# Patient Record
Sex: Female | Born: 1970 | Race: Black or African American | Hispanic: No | Marital: Married | Smoking: Former smoker
Health system: Southern US, Community
[De-identification: ages and names within clinical notes are randomized; demographics above are authoritative.]

## PROBLEM LIST (undated history)

## (undated) DIAGNOSIS — G4730 Sleep apnea, unspecified: Secondary | ICD-10-CM

## (undated) DIAGNOSIS — J302 Other seasonal allergic rhinitis: Secondary | ICD-10-CM

## (undated) HISTORY — DX: Sleep apnea, unspecified: G47.30

## (undated) HISTORY — PX: LAPAROSCOPY: SHX197

## (undated) HISTORY — DX: Other seasonal allergic rhinitis: J30.2

---

## 2010-11-17 ENCOUNTER — Other Ambulatory Visit (HOSPITAL_COMMUNITY)
Admission: RE | Admit: 2010-11-17 | Discharge: 2010-11-17 | Disposition: A | Discharge status: Home / Self Care | Payer: BC Managed Care – PPO | Source: Ambulatory Visit | Attending: Family Medicine | Admitting: Family Medicine

## 2010-11-17 DIAGNOSIS — Z0000 Encounter for general adult medical examination without abnormal findings: Secondary | ICD-10-CM | POA: Insufficient documentation

## 2013-03-21 ENCOUNTER — Encounter (HOSPITAL_COMMUNITY): Payer: Self-pay | Admitting: Emergency Medicine

## 2013-03-21 ENCOUNTER — Emergency Department (HOSPITAL_COMMUNITY)
Admission: EM | Admit: 2013-03-21 | Discharge: 2013-03-21 | Disposition: A | Discharge status: Home / Self Care | Payer: Commercial Indemnity | Attending: Emergency Medicine | Admitting: Emergency Medicine

## 2013-03-21 DIAGNOSIS — R110 Nausea: Secondary | ICD-10-CM | POA: Insufficient documentation

## 2013-03-21 DIAGNOSIS — R1013 Epigastric pain: Secondary | ICD-10-CM | POA: Diagnosis not present

## 2013-03-21 DIAGNOSIS — R0602 Shortness of breath: Secondary | ICD-10-CM | POA: Insufficient documentation

## 2013-03-21 DIAGNOSIS — R209 Unspecified disturbances of skin sensation: Secondary | ICD-10-CM | POA: Insufficient documentation

## 2013-03-21 DIAGNOSIS — R42 Dizziness and giddiness: Secondary | ICD-10-CM | POA: Diagnosis not present

## 2013-03-21 DIAGNOSIS — R197 Diarrhea, unspecified: Secondary | ICD-10-CM | POA: Insufficient documentation

## 2013-03-21 DIAGNOSIS — T7840XA Allergy, unspecified, initial encounter: Secondary | ICD-10-CM

## 2013-03-21 DIAGNOSIS — Z87891 Personal history of nicotine dependence: Secondary | ICD-10-CM | POA: Insufficient documentation

## 2013-03-21 DIAGNOSIS — H539 Unspecified visual disturbance: Secondary | ICD-10-CM | POA: Insufficient documentation

## 2013-03-21 DIAGNOSIS — I10 Essential (primary) hypertension: Secondary | ICD-10-CM | POA: Insufficient documentation

## 2013-03-21 DIAGNOSIS — R51 Headache: Secondary | ICD-10-CM | POA: Insufficient documentation

## 2013-03-21 DIAGNOSIS — Z79899 Other long term (current) drug therapy: Secondary | ICD-10-CM | POA: Insufficient documentation

## 2013-03-21 LAB — LIPASE, BLOOD: Lipase: 26 U/L (ref 11–59)

## 2013-03-21 LAB — COMPREHENSIVE METABOLIC PANEL
ALT: 14 U/L (ref 0–35)
AST: 19 U/L (ref 0–37)
Albumin: 4 g/dL (ref 3.5–5.2)
Alkaline Phosphatase: 58 U/L (ref 39–117)
Chloride: 103 mEq/L (ref 96–112)
Creatinine, Ser: 0.93 mg/dL (ref 0.50–1.10)
GFR calc Af Amer: 86 mL/min — ABNORMAL LOW (ref >90)
GFR calc non Af Amer: 74 mL/min — ABNORMAL LOW (ref >90)
Glucose, Bld: 95 mg/dL (ref 70–99)
Potassium: 4.5 mEq/L (ref 3.5–5.1)
Sodium: 139 mEq/L (ref 135–145)
Total Bilirubin: 0.3 mg/dL (ref 0.3–1.2)

## 2013-03-21 LAB — CBC WITH DIFFERENTIAL/PLATELET
Eosinophils Absolute: 0 10*3/uL (ref 0.0–0.7)
Eosinophils Relative: 0 % (ref 0–5)
Lymphs Abs: 2.5 10*3/uL (ref 0.7–4.0)
MCH: 28.8 pg (ref 26.0–34.0)
MCHC: 32.5 g/dL (ref 30.0–36.0)
MCV: 88.9 fL (ref 78.0–100.0)
Monocytes Relative: 6 % (ref 3–12)
Neutrophils Relative %: 68 % (ref 43–77)
Platelets: 277 10*3/uL (ref 150–400)
RBC: 4.68 MIL/uL (ref 3.87–5.11)

## 2013-03-21 MED ORDER — FAMOTIDINE IN NACL 20-0.9 MG/50ML-% IV SOLN
20.0000 mg | Freq: Once | INTRAVENOUS | Status: AC
  Administered 2013-03-21: 20 mg via INTRAVENOUS
  Filled 2013-03-21: qty 50

## 2013-03-21 MED ORDER — FAMOTIDINE 20 MG PO TABS: 20.0000 mg | ORAL_TABLET | Freq: Two times a day (BID) | ORAL | Status: DC

## 2013-03-21 MED ORDER — ALBUTEROL SULFATE HFA 108 (90 BASE) MCG/ACT IN AERS
2.0000 | INHALATION_SPRAY | Freq: Once | RESPIRATORY_TRACT | Status: DC
  Filled 2013-03-21: qty 6.7

## 2013-03-21 MED ORDER — PREDNISONE (PAK) 10 MG PO TABS: ORAL_TABLET | Freq: Every day | ORAL | Status: DC

## 2013-03-21 MED ORDER — METHYLPREDNISOLONE SODIUM SUCC 125 MG IJ SOLR
125.0000 mg | Freq: Once | INTRAMUSCULAR | Status: AC
  Administered 2013-03-21: 125 mg via INTRAVENOUS
  Filled 2013-03-21: qty 2

## 2013-03-21 MED ORDER — DIPHENHYDRAMINE HCL 25 MG PO TABS: 25.0000 mg | ORAL_TABLET | Freq: Four times a day (QID) | ORAL | Status: DC

## 2013-03-21 MED ORDER — DIPHENHYDRAMINE HCL 50 MG/ML IJ SOLN
25.0000 mg | Freq: Once | INTRAMUSCULAR | Status: AC
  Administered 2013-03-21: 25 mg via INTRAVENOUS
  Filled 2013-03-21: qty 1

## 2013-03-21 NOTE — ED Provider Notes (Signed)
Medical screening examination/treatment/procedure(s) were performed by non-physician practitioner and as supervising physician I was immediately available for consultation/collaboration.  EKG Interpretation   None         Nataly Pacifico H Gared Gillie, MD 03/21/13 2216 

## 2013-03-21 NOTE — ED Notes (Signed)
Pt states she woke this morning had hives, nausea, headache, diarrhea.  Pt has had these same symptoms before.  Pt states she is feeling better now.  Pt states "I took my tampon out this morning and my insides felt like mush."  Pt concerned about toxic shock syndrome?

## 2013-03-21 NOTE — ED Provider Notes (Signed)
CSN: [id number]     Arrival date & time 03/21/13  1447 History   First MD Initiated Contact with Patient 03/21/13 1638     Chief Complaint  Patient presents with  . Headache  . Nausea  . Diarrhea  . Urticaria   (Consider location/radiation/quality/duration/timing/severity/associated sxs/prior Treatment) HPI Patient has a history of allergic reactions from unknown cause for the past two years, has seen [place] allergist.  Reports similar symptoms starting this morning.  Has had headache, lightheadedness, tingling all over her body, feeling like her chest is caving in, epigastric tenderness, "eyes weak", 4 soft bowel movements, throat tightness.  The course was improved after taking methylprednisone this morning, but she felt it might be getting worse so she came in.  States she last had an episode one month ago during her period, states she started having symptoms when she first started her period Dec 7, and this morning removed her overnight tampon (in approximately 10-11 hours) and felt better immediately.    History reviewed. No pertinent past medical history. Past Surgical History  Procedure Laterality Date  . Laparoscopy    . Cesarean section     No family history on file. History  Substance Use Topics  . Smoking status: Former Games developer  . Smokeless tobacco: Not on file  . Alcohol Use: Yes   OB History   Grav Para Term Preterm Abortions TAB SAB Ect Mult Living                 Review of Systems  Constitutional: Negative for fever and chills.  HENT: Negative for trouble swallowing.   Eyes: Positive for visual disturbance.  Respiratory: Positive for shortness of breath. Negative for cough.   Cardiovascular: Negative for chest pain.  Gastrointestinal: Positive for abdominal pain and diarrhea. Negative for nausea and vomiting.  Genitourinary: Negative for dysuria, urgency, frequency, vaginal bleeding and vaginal discharge.  Musculoskeletal: Negative for myalgias.   Neurological: Positive for dizziness and headaches.    Allergies  Sulfa antibiotics  Home Medications   Current Outpatient Rx  Name  Route  Sig  Dispense  Refill  . EPINEPHrine (EPI-PEN) 0.3 mg/0.3 mL SOAJ injection   Intramuscular   Inject 0.3 mg into the muscle once.         . fexofenadine (ALLEGRA) 180 MG tablet   Oral   Take 360 mg by mouth daily.         . methylPREDNISolone (MEDROL) 4 MG tablet   Oral   Take 4 mg by mouth daily as needed (allergic reaction).          BP 136/95  Pulse 86  Temp(Src) 99.4 F (37.4 C) (Oral)  Resp 16  Wt 192 lb 14.4 oz (87.499 kg)  SpO2 100%  LMP 03/21/2013 Physical Exam  Nursing note and vitals reviewed. Constitutional: She appears well-developed and well-nourished. No distress.  HENT:  Head: Normocephalic and atraumatic.  Mouth/Throat: Oropharynx is clear and moist. No oropharyngeal exudate.  Airway widely patent.  No edema.   Neck: Neck supple.  Cardiovascular: Normal rate and regular rhythm.   Pulmonary/Chest: Effort normal and breath sounds normal. No stridor. No respiratory distress. She has no wheezes. She has no rales.  Abdominal: Soft. She exhibits no distension. There is tenderness in the epigastric area. There is no rebound and no guarding.  Neurological: She is alert.  Skin: She is not diaphoretic.    ED Course  Procedures (including critical care time) Labs Review Labs Reviewed  COMPREHENSIVE METABOLIC PANEL -  Abnormal; Notable for the following:    Total Protein 8.4 (*)    GFR calc non Af Amer 74 (*)    GFR calc Af Amer 86 (*)    All other components within normal limits  CBC WITH DIFFERENTIAL  LIPASE, BLOOD   Imaging Review No results found.  EKG Interpretation   None      Discussed pt with Dr Silverio Lay.   MDM   1. Allergic reaction, initial encounter     Pt with hx allergic reaction with unknown cause.  Appears she may be allergic to her tampons.  No airway concerns.  Lungs are clear.  No  rash, though patient feels tingling that usually precedes her rash, and has also taken her home methylprednisone.  Labs unremarkable.  Vitals normal.  Pt was concerned about TSS, but I see no evidence of this.   Likely allergic reaction.  IV solu medrol, benedryl, pepcid given here.  Pt remained well-appearing, no appearance of rash.  No worsening of airway.  After medications denied SOB.  Offered continued monitoring but patient was eager to get home.  D/C with strict return precautions.  As patient seems to be very in tune with her symptoms, has good follow up with PCP and allergist, has an epi pen, and has been dealing with this intermittently for two years, I feel confident that she will return for any worsening and will follow up.  D/C home with prednisone, benadryl, pepcid.  PCP, allergist follow up.  I suspect she is allergic to a component of the tampons she is using.  I have advised her to stop using any tampons until she follows up with her doctor and to take the box to her next appointment. Discussed results, findings, treatment, and follow up  with patient.  Pt given return precautions.  Pt verbalizes understanding and agrees with plan.        Trixie Dredge, PA-C 03/21/13 1821

## 2013-12-20 ENCOUNTER — Other Ambulatory Visit (HOSPITAL_COMMUNITY)
Admission: RE | Admit: 2013-12-20 | Discharge: 2013-12-20 | Disposition: A | Discharge status: Home / Self Care | Payer: Commercial Indemnity | Source: Ambulatory Visit | Attending: Nurse Practitioner | Admitting: Nurse Practitioner

## 2013-12-20 ENCOUNTER — Other Ambulatory Visit: Payer: Self-pay | Admitting: Nurse Practitioner

## 2013-12-20 DIAGNOSIS — R87810 Cervical high risk human papillomavirus (HPV) DNA test positive: Secondary | ICD-10-CM | POA: Insufficient documentation

## 2013-12-20 DIAGNOSIS — Z1151 Encounter for screening for human papillomavirus (HPV): Secondary | ICD-10-CM | POA: Diagnosis present

## 2013-12-20 DIAGNOSIS — Z124 Encounter for screening for malignant neoplasm of cervix: Secondary | ICD-10-CM | POA: Insufficient documentation

## 2013-12-25 LAB — CYTOLOGY - PAP

## 2014-01-08 ENCOUNTER — Other Ambulatory Visit: Payer: Self-pay | Admitting: Nurse Practitioner

## 2014-07-17 ENCOUNTER — Other Ambulatory Visit (HOSPITAL_COMMUNITY): Payer: Self-pay | Admitting: Family Medicine

## 2014-07-17 DIAGNOSIS — R109 Unspecified abdominal pain: Secondary | ICD-10-CM

## 2014-07-17 DIAGNOSIS — K625 Hemorrhage of anus and rectum: Secondary | ICD-10-CM

## 2014-07-17 DIAGNOSIS — R110 Nausea: Secondary | ICD-10-CM

## 2014-07-18 ENCOUNTER — Encounter (HOSPITAL_COMMUNITY): Payer: Self-pay

## 2014-07-18 ENCOUNTER — Ambulatory Visit (HOSPITAL_COMMUNITY)
Admission: RE | Admit: 2014-07-18 | Discharge: 2014-07-18 | Disposition: A | Discharge status: Home / Self Care | Payer: Managed Care, Other (non HMO) | Source: Ambulatory Visit | Attending: Family Medicine | Admitting: Family Medicine

## 2014-07-18 DIAGNOSIS — R110 Nausea: Secondary | ICD-10-CM | POA: Insufficient documentation

## 2014-07-18 DIAGNOSIS — R109 Unspecified abdominal pain: Secondary | ICD-10-CM | POA: Diagnosis not present

## 2014-07-18 DIAGNOSIS — K625 Hemorrhage of anus and rectum: Secondary | ICD-10-CM | POA: Diagnosis not present

## 2014-07-18 MED ORDER — IOHEXOL 300 MG/ML  SOLN
100.0000 mL | Freq: Once | INTRAMUSCULAR | Status: AC | PRN
  Administered 2014-07-18: 100 mL via INTRAVENOUS

## 2015-03-18 ENCOUNTER — Other Ambulatory Visit: Payer: Self-pay | Admitting: Gastroenterology

## 2016-01-22 ENCOUNTER — Other Ambulatory Visit: Payer: Self-pay | Admitting: Family

## 2016-01-22 ENCOUNTER — Ambulatory Visit
Admission: RE | Admit: 2016-01-22 | Discharge: 2016-01-22 | Disposition: A | Discharge status: Home / Self Care | Payer: Managed Care, Other (non HMO) | Source: Ambulatory Visit | Attending: Family | Admitting: Family

## 2016-01-22 DIAGNOSIS — R0600 Dyspnea, unspecified: Secondary | ICD-10-CM

## 2016-01-22 DIAGNOSIS — R062 Wheezing: Secondary | ICD-10-CM

## 2016-02-05 ENCOUNTER — Encounter: Payer: Self-pay | Admitting: Emergency Medicine

## 2016-02-05 ENCOUNTER — Ambulatory Visit (INDEPENDENT_AMBULATORY_CARE_PROVIDER_SITE_OTHER): Payer: Managed Care, Other (non HMO) | Admitting: Emergency Medicine

## 2016-02-05 DIAGNOSIS — R0602 Shortness of breath: Secondary | ICD-10-CM

## 2016-02-05 DIAGNOSIS — R0600 Dyspnea, unspecified: Secondary | ICD-10-CM | POA: Insufficient documentation

## 2016-02-05 NOTE — Progress Notes (Signed)
Subjective:    Patient ID: Sarah Conrad, female    DOB: 03/05/1971, 45 y.o.   MRN: [id number]  HPI  45 year old woman with a minimal tobacco history (2 pack years), allergic rhinitis, sleep apnea diagnosed 2010 sometimes on CPAP, but not since 2 weeks ago. Also with a hx of GERD and allergic rhinitis / urticaria. She is noted to have a normal exercise tolerance test, echocardiogram and Holter monitor on evaluation for palpitations by Dr Nadara Eaton. She is referred today for evaluation of dyspnea and wheeze.  She reports that this has been longstanding. It can be episodic, feels like she is unable to move air, a tightness in her UA or chest. Can happen either at rest or w exertion. She has to wait for relief, sometimes 2 min, can be 30 min.   On prilosec 40 bid, allegra qd.   Review of Systems  Constitutional: Negative for fever and unexpected weight change.  HENT: Positive for sinus pressure. Negative for congestion, dental problem, ear pain, nosebleeds, postnasal drip, rhinorrhea, sneezing, sore throat and trouble swallowing.   Eyes: Negative for redness and itching.  Respiratory: Positive for shortness of breath and wheezing. Negative for cough and chest tightness.        Mucus production  Cardiovascular: Negative for palpitations and leg swelling.  Gastrointestinal: Negative for nausea and vomiting.  Genitourinary: Negative for dysuria.  Musculoskeletal: Negative for joint swelling.  Skin: Negative for rash.  Neurological: Positive for headaches.  Hematological: Does not bruise/bleed easily.  Psychiatric/Behavioral: Negative for dysphoric mood. The patient is not nervous/anxious.    Past Medical History:  Diagnosis Date  . Seasonal allergies   . Sleep apnea      History reviewed. No pertinent family history.   Social History   Social History  . Marital status: Married    Spouse name: N/A  . Number of children: N/A  . Years of education: N/A   Occupational History  . 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 Smoking status: Former Smoker    Packs/day: 0.25    Years: 2.00    Quit date: 04/12/1994  . Smokeless tobacco: Never Used  . Alcohol use Yes     Comment: 2-3 drinks of wine daily  . Drug use: No  . Sexual activity: Not on file   Other Topics Concern  . Not on file   Social History Narrative  . No narrative on file  no inhaled exposures She has lived in [place], [place], [place]   Allergies  Allergen Reactions  . Sulfa Antibiotics Hives     Outpatient Medications Prior to Visit  Medication Sig Dispense Refill  . EPINEPHrine (EPI-PEN) 0.3 mg/0.3 mL SOAJ injection Inject 0.3 mg into the muscle once.    . fexofenadine (ALLEGRA) 180 MG tablet Take 360 mg by mouth daily.    . diphenhydrAMINE (BENADRYL) 25 MG tablet Take 1 tablet (25 mg total) by mouth every 6 (six) hours. X 3 days, then as needed (Patient not taking: Reported on 02/05/2016) 20 tablet 0  . famotidine (PEPCID) 20 MG tablet Take 1 tablet (20 mg total) by mouth 2 (two) times daily. X 3 days then PRN (Patient not taking: Reported on 02/05/2016) 30 tablet 0  . methylPREDNISolone (MEDROL) 4 MG tablet Take 4 mg by mouth daily as needed (allergic reaction).    . predniSONE (STERAPRED UNI-PAK) 10 MG tablet Take by mouth daily. Day 1: take 6 tabs.  Day 2:  5 tabs  Day 3: 4 tabs  Day 4: 3 tabs  Day 5: 2 tabs  Day 6: 1 tab (Patient not taking: Reported on 02/05/2016) 21 tablet 0   No facility-administered medications prior to visit.         Objective:   Physical Exam Vitals:   02/05/16 1420  BP: 110/70  Pulse: 94  SpO2: 100%  Weight: 201 lb (91.2 kg)  Height: 5\' 10"  (1.778 m)   Gen: Pleasant, well-nourished, in no distress,  normal affect  ENT: No lesions,  mouth clear,  oropharynx clear, no postnasal drip, no stridor Neck: No JVD, no TMG, no carotid bruits  Lungs: No use of accessory muscles, clear without rales or rhonchi  Cardiovascular: RRR, heart sounds normal, no  murmur or gallops, no peripheral edema  Musculoskeletal: No deformities, no cyanosis or clubbing  Neuro: alert, non focal  Skin: Warm, no lesions or rashes    CXR 01/22/16 --  FINDINGS: The cardiomediastinal silhouette is unremarkable.  There is no evidence of focal airspace disease, pulmonary edema, suspicious pulmonary nodule/mass, pleural effusion, or pneumothorax. No acute bony abnormalities are identified.  IMPRESSION: No active cardiopulmonary disease.     Assessment & Plan:  Dyspnea Action sound upper airway in nature particularly with contribution of GERD. She denies any significant rhinitis although she is on Allegra. Need to evaluate and rule out lower airways disease. We will perform full pulmonary function testing. If no evidence of asthma then we can concentrate on the potential irritants of her upper airway.  Levy Pupaobert Colter Magowan, MD, PhD 02/05/2016, 2:44 PM [facility] [phone] or if no answer [phone]

## 2016-02-05 NOTE — Addendum Note (Signed)
Addended by: Maisie FusGREEN, Rashel Okeefe M on: 02/05/2016 02:52 PM   Modules accepted: Orders

## 2016-02-05 NOTE — Assessment & Plan Note (Signed)
Action sound upper airway in nature particularly with contribution of GERD. She denies any significant rhinitis although she is on Allegra. Need to evaluate and rule out lower airways disease. We will perform full pulmonary function testing. If no evidence of asthma then we can concentrate on the potential irritants of her upper airway.

## 2016-02-05 NOTE — Patient Instructions (Signed)
We will perform full pulmonary function testing Please continue your omeprazole and Allegra as you have been taking them Follow with Dr Delton CoombesByrum next available to decide our next steps.

## 2016-02-10 ENCOUNTER — Ambulatory Visit (HOSPITAL_COMMUNITY)
Admission: RE | Admit: 2016-02-10 | Discharge: 2016-02-10 | Disposition: A | Discharge status: Home / Self Care | Payer: Managed Care, Other (non HMO) | Source: Ambulatory Visit | Attending: Emergency Medicine | Admitting: Emergency Medicine

## 2016-02-10 DIAGNOSIS — Z029 Encounter for administrative examinations, unspecified: Secondary | ICD-10-CM | POA: Insufficient documentation

## 2016-02-10 DIAGNOSIS — R0602 Shortness of breath: Secondary | ICD-10-CM

## 2016-02-10 LAB — PULMONARY FUNCTION TEST
DL/VA % pred: 63 %
DL/VA: 3.47 ml/min/mmHg/L
DLCO unc % pred: 53 %
DLCO unc: 17.41 ml/min/mmHg
FEF 25-75 Post: 3.91 L/sec
FEF 25-75 Pre: 3.72 L/sec
FEF2575-%Change-Post: 5 %
FEF2575-%PRED-PRE: 123 %
FEF2575-%Pred-Post: 129 %
FEV1-%Change-Post: 1 %
FEV1-%PRED-PRE: 103 %
FEV1-%Pred-Post: 105 %
FEV1-POST: 3.12 L
FEV1-PRE: 3.06 L
FEV1FVC-%Change-Post: 0 %
FEV1FVC-%Pred-Pre: 106 %
FEV6-%CHANGE-POST: 1 %
FEV6-%PRED-PRE: 97 %
FEV6-%Pred-Post: 99 %
FEV6-Post: 3.58 L
FEV6-Pre: 3.52 L
FEV6FVC-%PRED-PRE: 102 %
FEV6FVC-%Pred-Post: 102 %
FVC-%Change-Post: 1 %
FVC-%Pred-Post: 97 %
FVC-%Pred-Pre: 95 %
FVC-POST: 3.58 L
FVC-Pre: 3.52 L
POST FEV6/FVC RATIO: 100 %
Post FEV1/FVC ratio: 87 %
Pre FEV1/FVC ratio: 87 %
Pre FEV6/FVC Ratio: 100 %
RV % pred: 69 %
RV: 1.41 L
TLC % PRED: 81 %
TLC: 4.85 L

## 2016-02-10 MED ORDER — ALBUTEROL SULFATE (2.5 MG/3ML) 0.083% IN NEBU
2.5000 mg | INHALATION_SOLUTION | Freq: Once | RESPIRATORY_TRACT | Status: AC
  Administered 2016-02-10: 2.5 mg via RESPIRATORY_TRACT

## 2016-02-17 ENCOUNTER — Ambulatory Visit (INDEPENDENT_AMBULATORY_CARE_PROVIDER_SITE_OTHER): Payer: Managed Care, Other (non HMO) | Admitting: Emergency Medicine

## 2016-02-17 ENCOUNTER — Encounter: Payer: Self-pay | Admitting: Emergency Medicine

## 2016-02-17 VITALS — BP 124/60 | HR 94 | Ht 70.0 in | Wt 201.0 lb

## 2016-02-17 DIAGNOSIS — K219 Gastro-esophageal reflux disease without esophagitis: Secondary | ICD-10-CM | POA: Diagnosis not present

## 2016-02-17 DIAGNOSIS — J358 Other chronic diseases of tonsils and adenoids: Secondary | ICD-10-CM

## 2016-02-17 DIAGNOSIS — J301 Allergic rhinitis due to pollen: Secondary | ICD-10-CM

## 2016-02-17 DIAGNOSIS — J309 Allergic rhinitis, unspecified: Secondary | ICD-10-CM | POA: Insufficient documentation

## 2016-02-17 DIAGNOSIS — R0602 Shortness of breath: Secondary | ICD-10-CM | POA: Diagnosis not present

## 2016-02-17 MED ORDER — FLUTICASONE PROPIONATE 50 MCG/ACT NA SUSP: 2.0000 | Freq: Every day | NASAL | 5 refills | Status: AC

## 2016-02-17 NOTE — Assessment & Plan Note (Signed)
Her cardiac workup has been reassuring. Cardiomegaly pulmonary-sinus test was normal. Pulmonary function testing shows normal spirometry without any evidence of a bronchodilator response. Her flow volume loops are normal. I believe that her dyspnea has been upper airway in nature. Need to work to do anything we can to decrease upper airway irritation. This includes treatment of her GERD, treatment of her rhinitis. She would also like to be referred to ENT for tonsil stones. I do not believe we need to start BD's at this time. If her sx persist then we could consider FOB or methacholine challenge   Please continue your allegra as you are taking it  Please continue your prilosec as you are taking it.  Try starting fluticasone nasal spray 2 sprays each nostril once a day  Consider doing nasal saline washes once a day  We will refer you to ENT for tonsil stones.  Please follow with Dr Delton CoombesByrum if not improving. We may consider performing bronchoscopy to look at the airways depending on your symptoms.

## 2016-02-17 NOTE — Patient Instructions (Addendum)
Please continue your allegra as you are taking it  Please continue your prilosec as you are taking it.  Try starting fluticasone nasal spray 2 sprays each nostril once a day  Consider doing nasal saline washes once a day  We will refer you to ENT for tonsil stones.  Please follow with Dr Delton CoombesByrum if not improving. We may consider performing bronchoscopy to look at the airways depending on your symptoms.

## 2016-02-17 NOTE — Progress Notes (Signed)
Subjective:    Patient ID: Sarah AsperPatricia Conrad, female    DOB: 1970-10-14, 45 y.o.   MRN: [id number]  HPI  45 year old woman with a minimal tobacco history (2 pack years), allergic rhinitis, sleep apnea diagnosed 2010 sometimes on CPAP, but not since 2 weeks ago. Also with a hx of GERD and allergic rhinitis / urticaria. She is noted to have a normal exercise tolerance test, echocardiogram and Holter monitor on evaluation for palpitations by Dr Nadara EatonGangi. She is referred today for evaluation of dyspnea and wheeze.  She reports that this has been longstanding. It can be episodic, feels like she is unable to move air, a tightness in her UA or chest. Can happen either at rest or w exertion. She has to wait for relief, sometimes 2 min, can be 30 min.   On prilosec 40 bid, allegra qd.   ROV 02/16/16 -- This is a follow-up visit for evaluation of dyspnea and wheezing. This can happen either with or without exertion and has some aspects that are consistent with upper airway disease. She's currently on Prilosec 40 mg twice a day and Allegra. We performed pulmonary function testing on 02/10/16 that I personally reviewed. This shows normal spirometry without a bronchodilator response, normal total lung capacity with a decreased RV consistent with possible mild restriction, and a low diffusion capacity.her breathing has not changed significantly.    Review of Systems  Constitutional: Negative for fever and unexpected weight change.  HENT: Positive for sinus pressure. Negative for congestion, dental problem, ear pain, nosebleeds, postnasal drip, rhinorrhea, sneezing, sore throat and trouble swallowing.   Eyes: Negative for redness and itching.  Respiratory: Positive for shortness of breath and wheezing. Negative for cough and chest tightness.        Mucus production  Cardiovascular: Negative for palpitations and leg swelling.  Gastrointestinal: Negative for nausea and vomiting.  Genitourinary: Negative for dysuria.    Musculoskeletal: Negative for joint swelling.  Skin: Negative for rash.  Neurological: Positive for headaches.  Hematological: Does not bruise/bleed easily.  Psychiatric/Behavioral: Negative for dysphoric mood. The patient is not nervous/anxious.    Past Medical History:  Diagnosis Date  . Seasonal allergies   . Sleep apnea      No family history on file.   Social History   Social History  . Marital status: Married    Spouse name: N/A  . Number of children: N/A  . Years of education: N/A   Occupational History  . 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 Smoking status: Former Smoker    Packs/day: 0.25    Years: 2.00    Quit date: 04/12/1994  . Smokeless tobacco: Never Used  . Alcohol use Yes     Comment: 2-3 drinks of wine daily  . Drug use: No  . Sexual activity: Not on file   Other Topics Concern  . Not on file   Social History Narrative  . No narrative on file  no inhaled exposures She has lived in [place], [place], [place]   Allergies  Allergen Reactions  . Sulfa Antibiotics Hives     Outpatient Medications Prior to Visit  Medication Sig Dispense Refill  . EPINEPHrine (EPI-PEN) 0.3 mg/0.3 mL SOAJ injection Inject 0.3 mg into the muscle once.    . fexofenadine (ALLEGRA) 180 MG tablet Take 360 mg by mouth daily.    Marland Kitchen. omeprazole (PRILOSEC) 40 MG capsule Take 40 mg by mouth 2 (two) times daily.  No facility-administered medications prior to visit.         Objective:   Physical Exam Vitals:   02/17/16 1211  BP: 124/60  Pulse: 94  SpO2: 100%  Weight: 201 lb 0.2 oz (91.2 kg)  Height: 5\' 10"  (1.778 m)   Gen: Pleasant, well-nourished, in no distress,  normal affect  ENT: No lesions,  mouth clear,  oropharynx clear, no postnasal drip, no stridor Neck: No JVD, no TMG, no carotid bruits  Lungs: No use of accessory muscles, clear without rales or rhonchi  Cardiovascular: RRR, heart sounds normal, no murmur or gallops, no peripheral  edema  Musculoskeletal: No deformities, no cyanosis or clubbing  Neuro: alert, non focal  Skin: Warm, no lesions or rashes    CXR 01/22/16 --  FINDINGS: The cardiomediastinal silhouette is unremarkable.  There is no evidence of focal airspace disease, pulmonary edema, suspicious pulmonary nodule/mass, pleural effusion, or pneumothorax. No acute bony abnormalities are identified.  IMPRESSION: No active cardiopulmonary disease.     Assessment & Plan:  Dyspnea Her cardiac workup has been reassuring. Cardiomegaly pulmonary-sinus test was normal. Pulmonary function testing shows normal spirometry without any evidence of a bronchodilator response. Her flow volume loops are normal. I believe that her dyspnea has been upper airway in nature. Need to work to do anything we can to decrease upper airway irritation. This includes treatment of her GERD, treatment of her rhinitis. She would also like to be referred to ENT for tonsil stones. I do not believe we need to start BD's at this time. If her sx persist then we could consider FOB or methacholine challenge   Please continue your allegra as you are taking it  Please continue your prilosec as you are taking it.  Try starting fluticasone nasal spray 2 sprays each nostril once a day  Consider doing nasal saline washes once a day  We will refer you to ENT for tonsil stones.  Please follow with Dr Delton CoombesByrum if not improving. We may consider performing bronchoscopy to look at the airways depending on your symptoms.     Levy Pupaobert Romilda Proby, MD, PhD 02/17/2016, 4:38 PM [facility] [phone] or if no answer [phone]

## 2017-07-20 IMAGING — CR DG CHEST 2V
2 series · 2 of 2 positions shown · non-contrast
Comparison: None.

CLINICAL DATA: Wheezing and shortness of breath for 1 month.

EXAM:
CHEST  2 VIEW

[w chest pa]
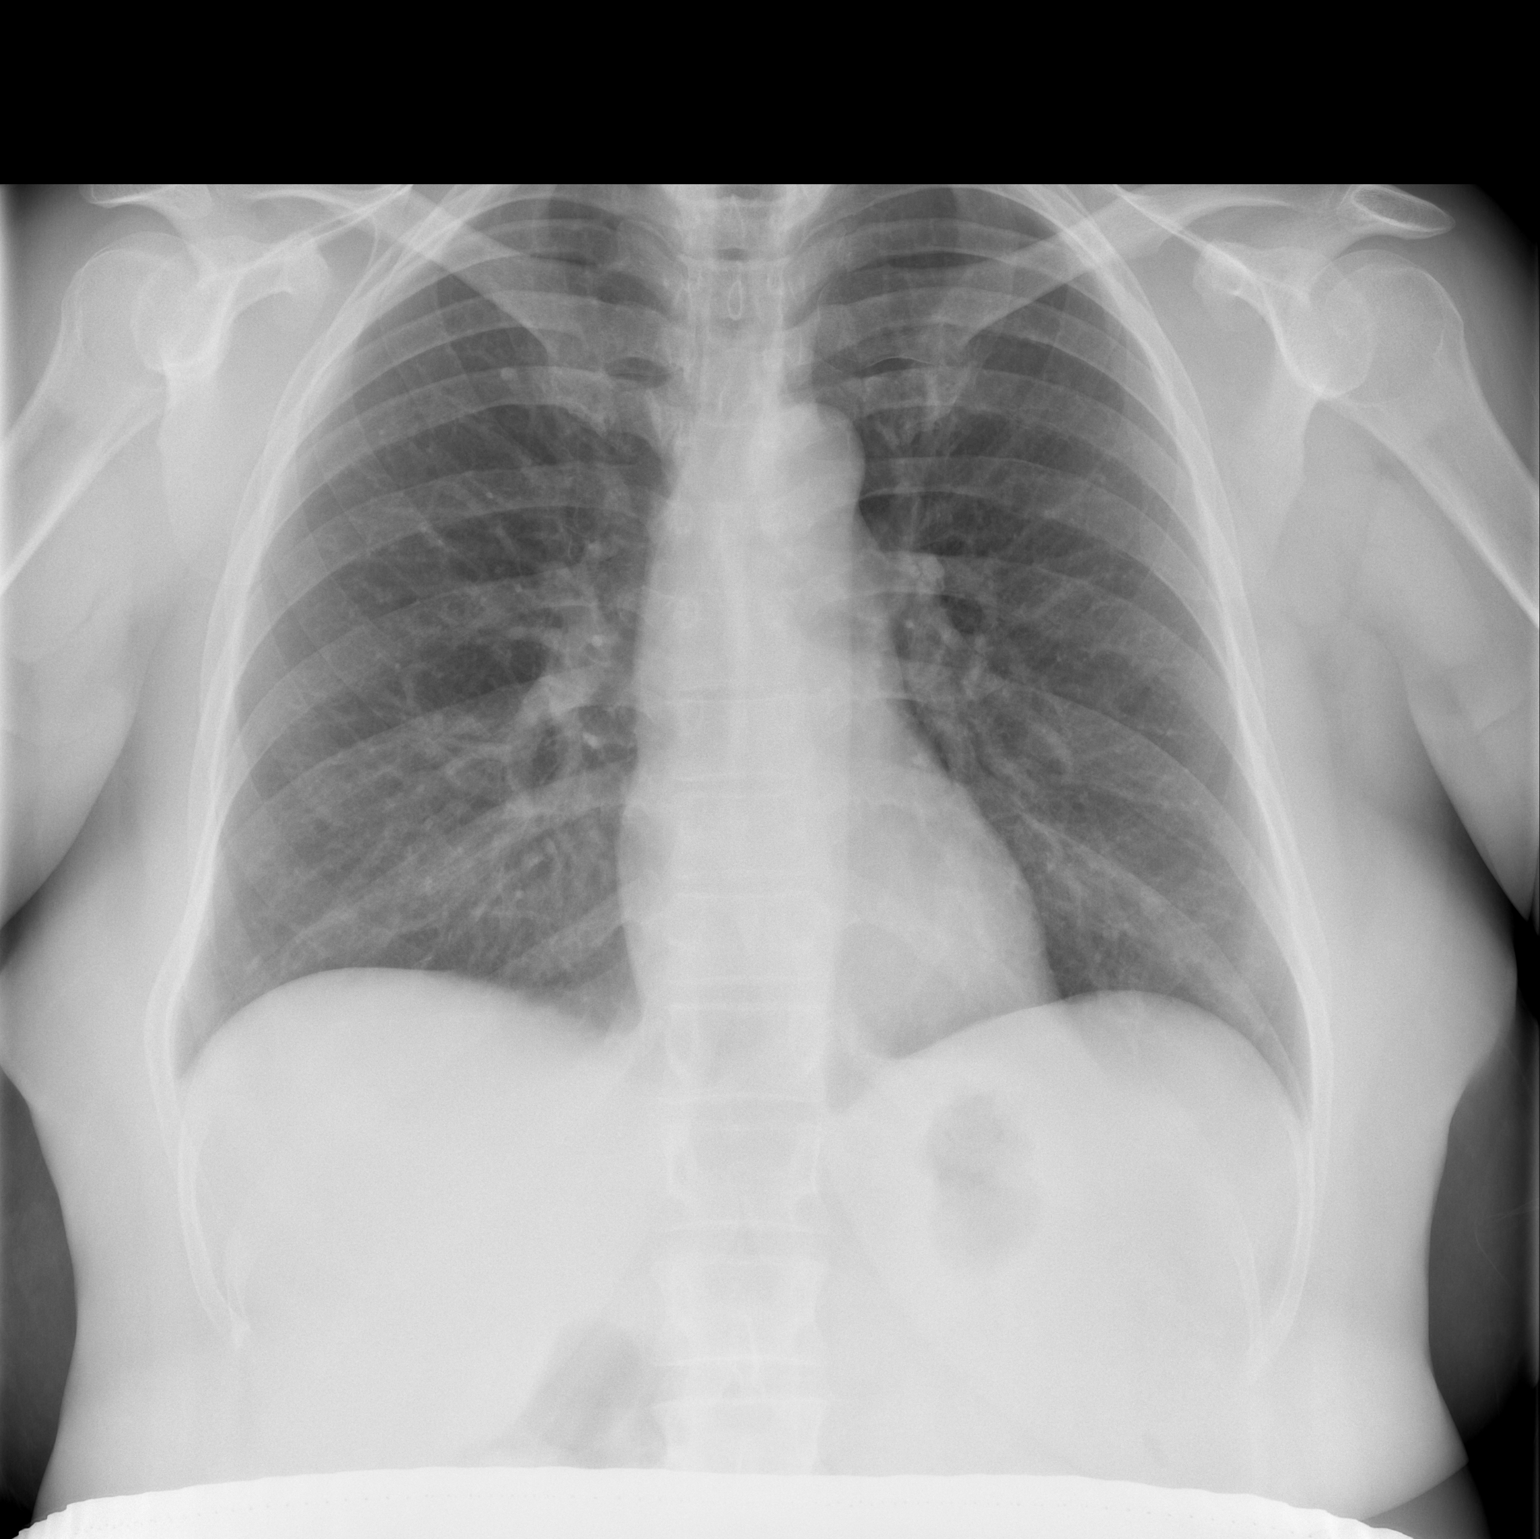

[w chest lat]
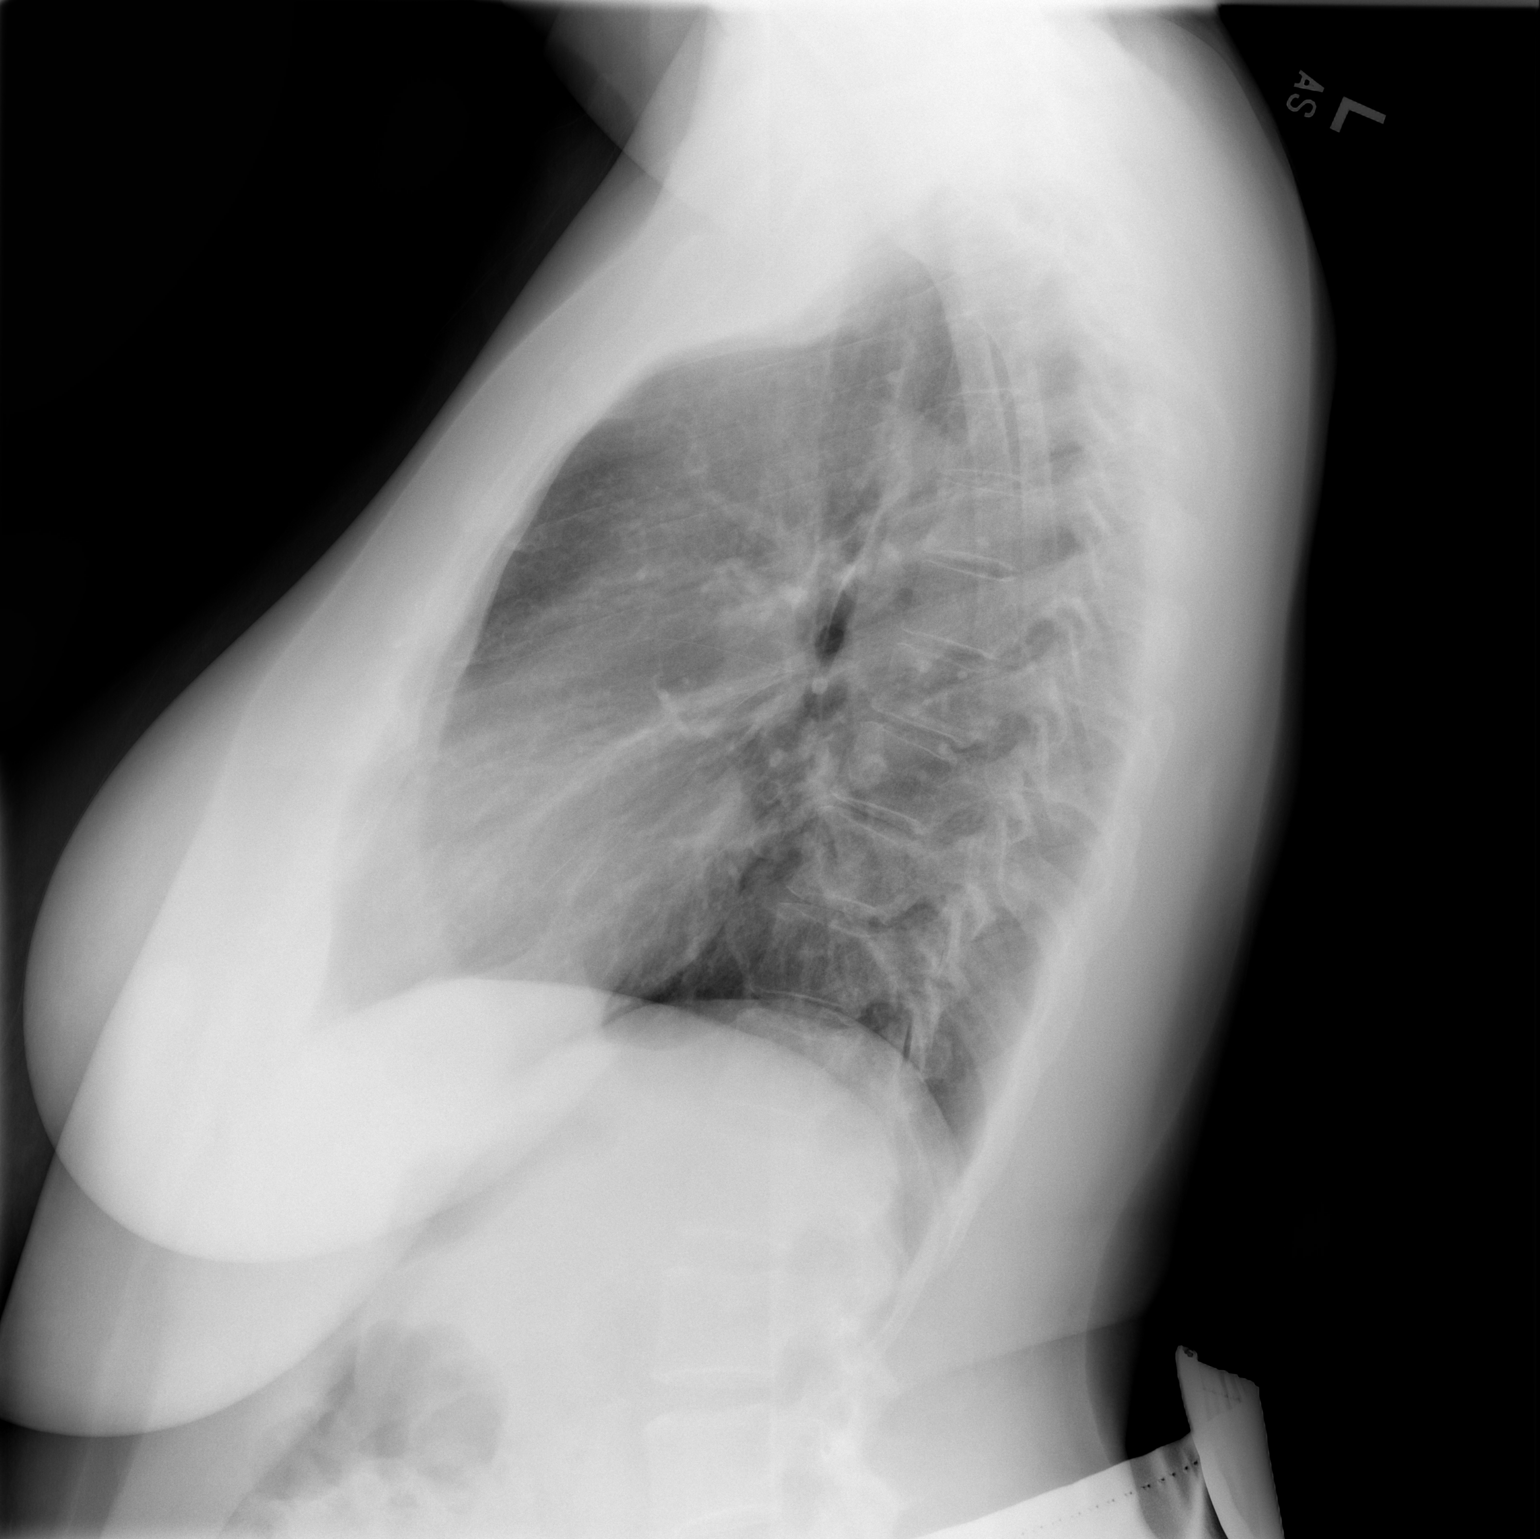

[2 of 2 positions shown; findings below may reference images not displayed]

FINDINGS: The cardiomediastinal silhouette is unremarkable.

There is no evidence of focal airspace disease, pulmonary edema,
suspicious pulmonary nodule/mass, pleural effusion, or pneumothorax.
No acute bony abnormalities are identified.
IMPRESSION: No active cardiopulmonary disease.
